# Patient Record
Sex: Male | Born: 1955 | Race: Asian | Hispanic: No | Marital: Married | Smoking: Former smoker
Health system: Southern US, Community
[De-identification: ages and names within clinical notes are randomized; demographics above are authoritative.]

## PROBLEM LIST (undated history)

## (undated) DIAGNOSIS — A159 Respiratory tuberculosis unspecified: Secondary | ICD-10-CM

## (undated) DIAGNOSIS — E119 Type 2 diabetes mellitus without complications: Secondary | ICD-10-CM

## (undated) DIAGNOSIS — G459 Transient cerebral ischemic attack, unspecified: Secondary | ICD-10-CM

## (undated) DIAGNOSIS — E785 Hyperlipidemia, unspecified: Secondary | ICD-10-CM

## (undated) DIAGNOSIS — H269 Unspecified cataract: Secondary | ICD-10-CM

## (undated) DIAGNOSIS — I10 Essential (primary) hypertension: Secondary | ICD-10-CM

## (undated) HISTORY — DX: Hyperlipidemia, unspecified: E78.5

## (undated) HISTORY — DX: Transient cerebral ischemic attack, unspecified: G45.9

## (undated) HISTORY — PX: NO PAST SURGERIES: SHX2092

## (undated) HISTORY — DX: Essential (primary) hypertension: I10

## (undated) HISTORY — DX: Unspecified cataract: H26.9

## (undated) HISTORY — PX: POLYPECTOMY: SHX149

## (undated) HISTORY — DX: Respiratory tuberculosis unspecified: A15.9

## (undated) HISTORY — DX: Type 2 diabetes mellitus without complications: E11.9

---

## 2000-03-31 DIAGNOSIS — G459 Transient cerebral ischemic attack, unspecified: Secondary | ICD-10-CM

## 2000-03-31 HISTORY — DX: Transient cerebral ischemic attack, unspecified: G45.9

## 2003-02-28 ENCOUNTER — Encounter: Payer: Self-pay | Admitting: Gastroenterology

## 2006-05-05 ENCOUNTER — Ambulatory Visit: Payer: Self-pay | Admitting: Gastroenterology

## 2006-05-06 ENCOUNTER — Ambulatory Visit: Payer: Self-pay | Admitting: Gastroenterology

## 2006-05-06 ENCOUNTER — Encounter (INDEPENDENT_AMBULATORY_CARE_PROVIDER_SITE_OTHER): Payer: Self-pay | Admitting: Specialist

## 2009-01-15 ENCOUNTER — Encounter: Admission: RE | Admit: 2009-01-15 | Discharge: 2009-01-15 | Payer: Self-pay | Admitting: Emergency Medicine

## 2009-06-14 ENCOUNTER — Encounter: Admission: RE | Admit: 2009-06-14 | Discharge: 2009-06-14 | Payer: Self-pay | Admitting: Emergency Medicine

## 2009-12-04 ENCOUNTER — Encounter: Admission: RE | Admit: 2009-12-04 | Discharge: 2009-12-04 | Payer: Self-pay | Admitting: Unknown Physician Specialty

## 2010-04-18 ENCOUNTER — Other Ambulatory Visit: Payer: Self-pay | Admitting: Gastroenterology

## 2010-04-18 ENCOUNTER — Ambulatory Visit
Admission: RE | Admit: 2010-04-18 | Discharge: 2010-04-18 | Payer: Self-pay | Source: Home / Self Care | Attending: Gastroenterology | Admitting: Gastroenterology

## 2010-04-18 DIAGNOSIS — R109 Unspecified abdominal pain: Secondary | ICD-10-CM | POA: Insufficient documentation

## 2010-04-18 LAB — BASIC METABOLIC PANEL
BUN: 19 mg/dL (ref 6–23)
CO2: 27 mEq/L (ref 19–32)
Calcium: 9.9 mg/dL (ref 8.4–10.5)
Chloride: 94 mEq/L — ABNORMAL LOW (ref 96–112)
Creatinine, Ser: 1.2 mg/dL (ref 0.4–1.5)
GFR: 66.08 mL/min (ref >60.00)
Glucose, Bld: 185 mg/dL — ABNORMAL HIGH (ref 70–99)
Potassium: 3.7 mEq/L (ref 3.5–5.1)
Sodium: 130 mEq/L — ABNORMAL LOW (ref 135–145)

## 2010-04-18 LAB — CBC WITH DIFFERENTIAL/PLATELET
Basophils Absolute: 0 10*3/uL (ref 0.0–0.1)
Basophils Relative: 0.5 % (ref 0.0–3.0)
Eosinophils Absolute: 0.2 10*3/uL (ref 0.0–0.7)
Eosinophils Relative: 1.6 % (ref 0.0–5.0)
HCT: 43.9 % (ref 39.0–52.0)
Hemoglobin: 15.2 g/dL (ref 13.0–17.0)
Lymphocytes Relative: 21.3 % (ref 12.0–46.0)
Lymphs Abs: 2.1 10*3/uL (ref 0.7–4.0)
MCHC: 34.6 g/dL (ref 30.0–36.0)
MCV: 90.9 fl (ref 78.0–100.0)
Monocytes Absolute: 1.2 10*3/uL — ABNORMAL HIGH (ref 0.1–1.0)
Monocytes Relative: 12.2 % — ABNORMAL HIGH (ref 3.0–12.0)
Neutro Abs: 6.4 10*3/uL (ref 1.4–7.7)
Neutrophils Relative %: 64.4 % (ref 43.0–77.0)
Platelets: 374 10*3/uL (ref 150.0–400.0)
RBC: 4.83 Mil/uL (ref 4.22–5.81)
RDW: 12.4 % (ref 11.5–14.6)
WBC: 10 10*3/uL (ref 4.5–10.5)

## 2010-04-18 LAB — HEPATIC FUNCTION PANEL
ALT: 16 U/L (ref 0–53)
AST: 12 U/L (ref 0–37)
Albumin: 4 g/dL (ref 3.5–5.2)
Alkaline Phosphatase: 19 U/L — ABNORMAL LOW (ref 39–117)
Bilirubin, Direct: 0.1 mg/dL (ref 0.0–0.3)
Total Bilirubin: 0.5 mg/dL (ref 0.3–1.2)
Total Protein: 7.4 g/dL (ref 6.0–8.3)

## 2010-04-18 LAB — TSH: TSH: 2.15 u[IU]/mL (ref 0.35–5.50)

## 2010-04-22 ENCOUNTER — Other Ambulatory Visit: Payer: Self-pay | Admitting: Gastroenterology

## 2010-04-22 ENCOUNTER — Ambulatory Visit
Admission: RE | Admit: 2010-04-22 | Discharge: 2010-04-22 | Payer: Self-pay | Source: Home / Self Care | Attending: Gastroenterology | Admitting: Gastroenterology

## 2010-04-22 LAB — BASIC METABOLIC PANEL
BUN: 14 mg/dL (ref 6–23)
CO2: 27 mEq/L (ref 19–32)
Calcium: 9.7 mg/dL (ref 8.4–10.5)
Chloride: 97 mEq/L (ref 96–112)
Creatinine, Ser: 1.2 mg/dL (ref 0.4–1.5)
GFR: 70.07 mL/min (ref >60.00)
Glucose, Bld: 346 mg/dL — ABNORMAL HIGH (ref 70–99)
Potassium: 3.9 mEq/L (ref 3.5–5.1)
Sodium: 132 mEq/L — ABNORMAL LOW (ref 135–145)

## 2010-05-02 ENCOUNTER — Other Ambulatory Visit: Payer: Self-pay | Admitting: Gastroenterology

## 2010-05-02 ENCOUNTER — Encounter: Payer: Self-pay | Admitting: Gastroenterology

## 2010-05-02 ENCOUNTER — Encounter (INDEPENDENT_AMBULATORY_CARE_PROVIDER_SITE_OTHER): Payer: Self-pay | Admitting: *Deleted

## 2010-05-02 ENCOUNTER — Other Ambulatory Visit: Payer: BC Managed Care – PPO

## 2010-05-02 ENCOUNTER — Ambulatory Visit (INDEPENDENT_AMBULATORY_CARE_PROVIDER_SITE_OTHER): Payer: BC Managed Care – PPO | Admitting: Gastroenterology

## 2010-05-02 ENCOUNTER — Ambulatory Visit: Admit: 2010-05-02 | Payer: Self-pay | Admitting: Gastroenterology

## 2010-05-02 DIAGNOSIS — E119 Type 2 diabetes mellitus without complications: Secondary | ICD-10-CM

## 2010-05-02 DIAGNOSIS — E871 Hypo-osmolality and hyponatremia: Secondary | ICD-10-CM

## 2010-05-02 DIAGNOSIS — R109 Unspecified abdominal pain: Secondary | ICD-10-CM

## 2010-05-02 LAB — BASIC METABOLIC PANEL
CO2: 25 mEq/L (ref 19–32)
Glucose, Bld: 345 mg/dL — ABNORMAL HIGH (ref 70–99)
Potassium: 3.8 mEq/L (ref 3.5–5.1)
Sodium: 132 mEq/L — ABNORMAL LOW (ref 135–145)

## 2010-05-02 NOTE — Assessment & Plan Note (Signed)
Summary: Abdominal pain, N/V   History of Present Illness Visit Type: Follow-up Visit Primary GI MD: Elie Goody MD [facility] Primary Provider: Theadora Rama, MD Requesting Provider: Theadora Rama, MD Chief Complaint: Patient c/o 1 week epigastric abdominal pain which is dull in nature. It has since moved to the periumbilical area. Patient c/o 2 episodes vomiting. History of Present Illness:   This is a 55 year old Asian male who relates the acute onset of epigastric pain, nausea, vomiting, fevers, and malaise, beginning one week ago. Temperature peaked at 101 and for the past 2 days he has not noted fever or vomiting. His epigastric pain has now improved and now has become a periumbilical abdominal discomfort. Nausea persists. No diarrhea or change in bowel habit noted.   GI Review of Systems    Reports abdominal pain, belching, loss of appetite, and  vomiting.     Location of  Abdominal pain: upper abdomen/periumbilical.    Denies acid reflux, bloating, chest pain, dysphagia with liquids, dysphagia with solids, heartburn, nausea, vomiting blood, weight loss, and  weight gain.        Denies anal fissure, black tarry stools, change in bowel habit, constipation, diarrhea, diverticulosis, fecal incontinence, heme positive stool, hemorrhoids, irritable bowel syndrome, jaundice, light color stool, liver problems, rectal bleeding, and  rectal pain. Preventive Screening-Counseling & Management  Alcohol-Tobacco     Smoking Status: quit  Caffeine-Diet-Exercise     Does Patient Exercise: no      Drug Use:  no.     Current Medications (verified): 1)  Aspirin 81 Mg Tbec (Aspirin) .... Take 1 Tablet By Mouth Once A Day 2)  Lisinopril 20 Mg Tabs (Lisinopril) .... Take 1 Tablet By Mouth Once Daily 3)  Zofran 4 Mg Tabs (Ondansetron Hcl) .... Take 1 Tablet By Mouth Four Times Daily As Needed For Nausea 4)  Hyoscyamine Sulfate 0.125 Mg Tabs (Hyoscyamine Sulfate) .... Take 1 Tablet By Mouth Two  Times A Day As Needed 5)  Fenofibrate 160 Mg Tabs (Fenofibrate) .... One Tablet By Mouth Once Daily 6)  Metformin Hcl 500 Mg Tabs (Metformin Hcl) .... 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 -stopped 8 years ago Illicit Drug Use - no Patient does not get regular exercise.  Smoking Status:  quit Drug Use:  no Does Patient Exercise:  no  Review of Systems       The pertinent positives and negatives are noted as above and in the HPI. All other ROS were reviewed and were negative.   Vital Signs:  Patient profile:   55 year old male Height:      70 inches Weight:      173.50 pounds BMI:     24.98 BSA:     1.97 Pulse rate:   84 / minute Pulse rhythm:   regular BP sitting:   104 / 70  (right arm)  Vitals Entered By: Lamona Curl CMA Duncan Dull) (April 18, 2010 10:35 AM)  Physical Exam  General:  Well developed, well nourished, no acute distress. Fatigued-appearing. Head:  Normocephalic and atraumatic. Eyes:  PERRLA, no icterus. Ears:  Normal auditory acuity. Mouth:  No deformity or lesions, dentition normal. Neck:  Supple; no masses or thyromegaly. Lungs:  Clear throughout to auscultation. Heart:  Regular rate and rhythm; no murmurs, rubs,  or bruits. Abdomen:  Soft, nontender and nondistended. No masses, hepatosplenomegaly or hernias noted. Normal bowel sounds. Msk:  Symmetrical with no gross deformities. Normal posture. Pulses:  Normal pulses noted. Extremities:  No clubbing, cyanosis, edema  or deformities noted. Neurologic:  Alert and  oriented x4;  grossly normal neurologically. Cervical Nodes:  No significant cervical adenopathy. Inguinal Nodes:  No significant inguinal adenopathy. Psych:  Alert and cooperative. Normal mood and affect.  Impression & Recommendations:  Problem # 1:  ABDOMINAL PAIN OTHER SPECIFIED SITE (ICD-789.09) Presumed gastroenteritis, likely viral. Obtain blood work. Push oral fluids and begin a bland diet with p.r.n. Zofran and Levsin. Return office visit in one week if the symptoms do not rapidly resolve we will proceed with further evaluation with abdominal ultrasound. Orders: TLB-CBC Platelet - w/Differential (85025-CBCD) TLB-BMP (Basic Metabolic Panel-BMET) (80048-METABOL) TLB-TSH (Thyroid Stimulating Hormone) (84443-TSH) TLB-Hepatic/Liver Function Pnl (80076-HEPATIC)  Problem # 2:  SCREENING COLORECTAL-CANCER (ICD-V76.51) Screening colonoscopy due to February 2018.  Patient Instructions: 1)  We have sent Hyoscyamine 0.125 mg tablets to your pharmacy for you to take 1 tablet by mouth two times a day as needed. 2)  We have also sent Zofran 4 mg tablets to your pharmacy for you to take up to four times daily as needed for nausea. 3)  Your physician has recommended that you have a [facility] Panel drawn on the basement floor before leaving the office today. 4)  Your follow-up visit with Dr. Russella Dar has been scheduled for 05/02/10 at 9:30am. 5)  Copy sent to : Theadora Rama, MD 6)  The medication list was reviewed and reconciled.  All changed / newly prescribed medications were explained.  A complete medication list was provided to the patient / caregiver.  Prescriptions: HYOSCYAMINE SULFATE 0.125 MG TABS (HYOSCYAMINE SULFATE) Take 1 tablet by mouth two times a day as needed  #60 x 0   Entered by:   Christie Nottingham CMA (AAMA)   Authorized by:   Meryl Dare MD [facility]   Signed by:   Christie Nottingham CMA (AAMA) on 04/18/2010   Method used:    [facility]        [facility]  [place] [address]       [address]       [place], [place]  [address]       Ph: [phone] or [phone]       Fax: [phone]   RxID:   [id number] ZOFRAN 4 MG TABS (ONDANSETRON HCL) Take 1 tablet by mouth four times daily as needed for nausea  #60 x 1   Entered by:   Christie Nottingham CMA (AAMA)   Authorized by:   Meryl Dare MD [facility]   Signed by:   Christie Nottingham CMA (AAMA) on 04/18/2010   Method used:   [facility]        [facility]  [place] [address]retail[facility]       [address]       [place], [place]  [address]       Ph: [phone] or [phone]       Fax: [phone]   RxID:   [id number]

## 2010-05-02 NOTE — Procedures (Signed)
Summary: Colonoscopy   Colonoscopy  Procedure date:  02/28/2003  Findings:      Results: Hemorrhoids.     Location:  [facility].    Procedures Next Due Date:    Colonoscopy: 02/2010 Patient Name: Malik Holland, Malik Holland MRN:  Procedure Procedures: Colonoscopy CPT: [address].  Personnel: Endoscopist: Venita Lick. Russella Dar, MD, Clementeen Graham.  Referred By: Rogene Houston, MD.  Exam Location: Exam performed in Outpatient Clinic. Outpatient  Patient Consent: Procedure, Alternatives, Risks and Benefits discussed, consent obtained, from patient. Consent was obtained by the RN.  Indications Symptoms: Hematochezia.  History  Current Medications: Patient is not currently taking Coumadin.  Pre-Exam Physical: Performed Feb 28, 2003. Entire physical exam was normal.  Exam Exam: Extent of exam reached: Cecum, extent intended: Cecum.  The cecum was identified by appendiceal orifice and IC valve. Colon retroflexion performed. ASA Classification: II. Tolerance: good.  Monitoring: Pulse and BP monitoring, Oximetry used. Supplemental O2 given.  Colon Prep Used Golytely for colon prep. Prep results: good.  Sedation Meds: Patient assessed and found to be appropriate for moderate (conscious) sedation. Fentanyl 75 mcg. given IV. Versed 6 mg. given IV.  Findings NORMAL EXAM: Cecum to Sigmoid Colon.  HEMORRHOIDS: Internal. Size: Small. Not bleeding. Not thrombosed. ICD9: Hemorrhoids, Internal: 455.0.   Assessment  Diagnoses: 455.0: Hemorrhoids, Internal.   Events  Unplanned Interventions: No intervention was required.  Unplanned Events: There were no complications. Plans Medication Plan: Continue current medications.  Patient Education: Patient given standard instructions for: Hemorrhoids.  Disposition: After procedure patient sent to recovery. After recovery patient sent home.  Scheduling/Referral: Colonoscopy, to [facility] T. Russella Dar, MD, [facility], around Feb 27, 2010.    This report  was created from the original endoscopy report, which was reviewed and signed by the above listed endoscopist.     cc: Dianna Limbo, MD

## 2010-05-02 NOTE — Procedures (Signed)
Summary: Colonoscopy   Colonoscopy  Procedure date:  05/06/2006  Findings:      Results: Hemorrhoids.     Location:  [facility].    Procedures Next Due Date:    Colonoscopy: 05/2016 Patient Name: Malik Holland, Malik Holland MRN:  Procedure Procedures: Colonoscopy CPT: [address].    with biopsy. CPT: Q5068410.  Personnel: Endoscopist: Venita Lick. Russella Dar, MD, Clementeen Graham.  Exam Location: Exam performed in Outpatient Clinic. Outpatient  Patient Consent: Procedure, Alternatives, Risks and Benefits discussed, consent obtained, from patient. Consent was obtained by the RN.  Indications Symptoms: Diarrhea Weight Loss. Change in bowel habits.  History  Current Medications: Patient is not currently taking Coumadin.  Pre-Exam Physical: Performed May 06, 2006. Cardio-pulmonary exam, Rectal exam, HEENT exam , Abdominal exam, Mental status exam WNL.  Comments: Pt. history reviewed/updated, physical exam performed prior to initiation of sedation?Yes Exam Exam: Extent of exam reached: Cecum, extent intended: Cecum.  The cecum was identified by appendiceal orifice and IC valve. Time to Cecum: 00:03: 07. Time for Withdrawl: 00:09:12. Colon retroflexion performed. ASA Classification: II. Tolerance: excellent.  Monitoring: Pulse and BP monitoring, Oximetry used. Supplemental O2 given.  Colon Prep Used MoviPrep for colon prep. Prep results: excellent.  Sedation Meds: Patient assessed and found to be appropriate for moderate (conscious) sedation. Fentanyl 75 mcg. given IV. Versed 8 mg. given IV.  Findings NORMAL EXAM: Terminal Ileum to Sigmoid Colon. Biopsy/Normal Exam taken. Comments: TI mucosa appears atrophic-random biopsies taken in the TI and colon.  HEMORRHOIDS: Internal. Size: Small. Not bleeding. Not thrombosed. ICD9: Hemorrhoids, Internal: 455.0.   Assessment  Diagnoses: 455.0: Hemorrhoids, Internal.   Events  Unplanned Interventions: No intervention was required.  Unplanned  Events: There were no complications. Plans  Post Exam Instructions: Post sedation instructions given.  Medication Plan: Await pathology. Antibiotics: Flagyl/Metronidazole 500mg  BID, for 7 days.  Antispasmodics: Hyoscyamine SL prn,   Patient Education: Patient given standard instructions for: Hemorrhoids.  Disposition: After procedure patient sent to recovery. After recovery patient sent home.  Scheduling/Referral: Colonoscopy, to [facility] T. Russella Dar, MD, [facility], around May 06, 2016.  Office Visit, to [employer]. Russella Dar, MD, [facility], around May 27, 2006.    This report was created from the original endoscopy report, which was reviewed and signed by the above listed endoscopist.    cc: Theadora Rama, MD

## 2010-05-08 NOTE — Assessment & Plan Note (Signed)
Summary: F/U ABD PAIN//ALL   Vital Signs:  Patient profile:   55 year old male Height:      70 inches Weight:      173 pounds BMI:     24.91 Pulse rate:   80 / minute Pulse rhythm:   regular BP sitting:   140 / 90  (left arm)  Vitals Entered By: Milford Cage NCMA (May 02, 2010 10:04 AM)  History of Present Illness Visit Type: Follow-up Visit Primary GI MD: Elie Goody MD [facility] Primary Provider: Theadora Rama, MD Requesting Provider: Theadora Rama, MD Chief Complaint: follow-up abdominal pain pt. states he is doing better. History of Present Illness:   This is a return office visit for abdominal pain, associated with nausea and vomiting. His symptoms abated after just 2 or 3 days, however, he still notes mild symptoms of early satiety. He did have a low sodium at 130 which improved to 132. His blood sugars have been running a little higher than usual.   GI Review of Systems      Denies abdominal pain, acid reflux, belching, bloating, chest pain, dysphagia with liquids, dysphagia with solids, heartburn, loss of appetite, nausea, vomiting, vomiting blood, weight loss, and  weight gain.        Denies anal fissure, black tarry stools, change in bowel habit, constipation, diarrhea, diverticulosis, fecal incontinence, heme positive stool, hemorrhoids, irritable bowel syndrome, jaundice, light color stool, liver problems, rectal bleeding, and  rectal pain.  Current Medications (verified): 1)  Aspirin 81 Mg Tbec (Aspirin) .... Take 1 Tablet By Mouth Once A Day 2)  Lisinopril 20 Mg Tabs (Lisinopril) .... Take 1 Tablet By Mouth Once Daily 3)  Zofran 4 Mg Tabs (Ondansetron Hcl) .... Take 1 Tablet By Mouth Four Times Daily As Needed For Nausea 4)  Hyoscyamine Sulfate 0.125 Mg Tabs (Hyoscyamine Sulfate) .... Take 1 Tablet By Mouth Two Times A Day As Needed 5)  Fenofibrate 160 Mg Tabs (Fenofibrate) .... One Tablet By Mouth Once Daily 6)  Metformin Hcl 500 Mg Tabs (Metformin Hcl)  .... One Tablet By Mouth Two Times A Day  Allergies (verified): No Known Drug Allergies  Past History:  Past Medical History: Reviewed history from 04/18/2010 and no changes required. Internal hemorrhoids Psoriasis Hyperlipidemia Hypertension Diabetes Type II Hx of TIA  Past Surgical History: Reviewed history from 04/18/2010 and no changes required. unremarkable  Family History: Reviewed history from 04/18/2010 and no changes required. Bile Duct cancer: Mother No FH of Colon Cancer: Family History of Prostate Cancer: Father Family History of Diabetes: Sister, Father  Social History: Reviewed history from 04/18/2010 and no changes required. 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 -stopped 8 years ago Illicit Drug Use - no Patient does not get regular exercise.   Physical Exam  General:  Well developed, well nourished, no acute distress. Head:  Normocephalic and atraumatic. Eyes:  PERRLA, no icterus. Ears:  Normal auditory acuity. Mouth:  No deformity or lesions, dentition normal. Lungs:  Clear throughout to auscultation. Heart:  Regular rate and rhythm; no murmurs, rubs,  or bruits. Abdomen:  Soft, nontender and nondistended. No masses, hepatosplenomegaly or hernias noted. Normal bowel sounds. Psych:  Alert and cooperative. Normal mood and affect.  Impression & Recommendations:  Problem # 1:  ABDOMINAL PAIN OTHER SPECIFIED SITE (ICD-789.09) Nausea vomiting, and abdominal pain all resolved rapidly. I suspect he had a self-limited gastroenteritis.  Problem # 2:  HYPONATREMIA (  ICD-276.1) Persistent hypernatremia. The second sodium of 132, was associated with a blood sugar of 346. Repeat BMET today and further followup with his primary physician and endocrinologist. Orders: TLB-BMP (Basic Metabolic Panel-BMET) (80048-METABOL)  Problem # 3:  DIABETES MELLITUS-TYPE II (ICD-250.00) He has an appointment with an  endocrinologist for further management of his diabetes mellitus.  Patient Instructions: 1)  Please go directly to the basement to have your labs drawn.  2)  Please continue current medications.  3)  Copy sent to : Theadora Rama, MD 4)  The medication list was reviewed and reconciled.  All changed / newly prescribed medications were explained.  A complete medication list was provided to the patient / caregiver.  Orders Added: 1)  TLB-BMP (Basic Metabolic Panel-BMET) [80048-METABOL]

## 2010-06-26 ENCOUNTER — Encounter: Payer: BC Managed Care – PPO | Attending: Endocrinology | Admitting: Dietician

## 2010-06-26 DIAGNOSIS — E119 Type 2 diabetes mellitus without complications: Secondary | ICD-10-CM | POA: Insufficient documentation

## 2010-06-26 DIAGNOSIS — Z713 Dietary counseling and surveillance: Secondary | ICD-10-CM | POA: Insufficient documentation

## 2010-08-16 NOTE — Assessment & Plan Note (Signed)
[facility]                         GASTROENTEROLOGY OFFICE NOTE   NAME:Malik Holland, Malik Holland                           MRN:          [id number]  DATE:05/05/2006                            DOB:          [birth year]    REFERRING PHYSICIAN:  Dr. Cleda Clarks   REASON FOR REFERRAL:  Change in bowel habits, diarrhea, and weight loss.   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  He relates a change in bowel habits that  began just before Christmas of 2007 with persistent symptoms until now.  He generally had about two formed bowel movements every 3 days as a  routine bowel pattern for many years and in mid December his bowel  pattern changed to three loose bowel movements over 2 days.  They were  generally brought on by meals and there was associated lower abdominal  discomfort around the time of bowel movement.  He has had about a 10  pound weight loss over the past 2 months as well.  He notes no melena,  hematochezia, change in stool caliber, nausea, vomiting, fevers, chills,  or loss of appetite.  His mother passed away on 04/03/06, with  bile duct cancer and his father moved in to live with him after this.  He states his diet has changed slightly since living with his father and  he now routinely eats breakfast which is new for him.  Other eating  habits have not changed.  He has no new medication usage and denies any  recent antibiotic usage.  He has been lactose intolerant for many years  and denies any lactose intake.  Blood work from April 27, 2006, shows  a normal CMET, lipid panel, thyroid panel, CBC, and urinalysis.  He  underwent a colonoscopy for hematochezia in November of 2004 which  showed only internal hemorrhoids.   FAMILY HISTORY:  Negative for colon cancer, colon polyps, and  inflammatory bowel disease.   CURRENT MEDICATIONS:  1. TriCor daily.  2. Aspirin 81 mg daily.   ALLERGIES:  No known drug allergies.   PAST MEDICAL HISTORY:  Psoriasis, internal hemorrhoids, hypertension,  hyperlipidemia.   SOCIAL HISTORY:   REVIEW OF SYSTEMS:  Per the handwritten form.   PHYSICAL EXAMINATION:  GENERAL:  No acute distress.  VITAL SIGNS:  Height 5 feet 10 inches, weight 182 pounds, blood pressure  128/84, pulse 72 and regular.  HEENT:  Anicteric sclerae, oropharynx clear.  CHEST:  Clear to auscultation bilaterally.  HEART:  Regular rate and rhythm without murmurs appreciated.  ABDOMEN:  Soft, nontender, and nondistended.  Normal active bowel  sounds.  No palpable organomegaly, masses, or hernias.  RECTAL:  Deferred at the time of colonoscopy.  NEUROLOGY:  Alert and oriented x3.  Grossly nonfocal.   ASSESSMENT:  Change in bowel habits with looser stools, intermittent  left lower quadrant pain, and an unexplained 10 pound weight loss.  Rule  out colorectal neoplasms, inflammatory bowel disease, and other  gastrointestinal disorders.  Consider also functional symptoms related  to  his family situation.  Risks, benefits, and alternatives to  colonoscopy with possible biopsy and possible polypectomy discussed with  the patient and he consents to proceed.  This will be scheduled  electively.     Venita Lick. Russella Dar, MD, [facility]  Electronically Signed    MTS/MedQ  DD: 05/05/2006  DT: 05/05/2006  Job #: [id number]   cc:   Theadora Rama, M.D.

## 2011-03-18 IMAGING — CR DG CHEST 2V
2 series · 2 of 2 positions shown · non-contrast
Comparison: 06/14/2009

CLINICAL DATA: Follow up pleural density

CHEST - 2 VIEW

[view not recorded (1 of 2)]
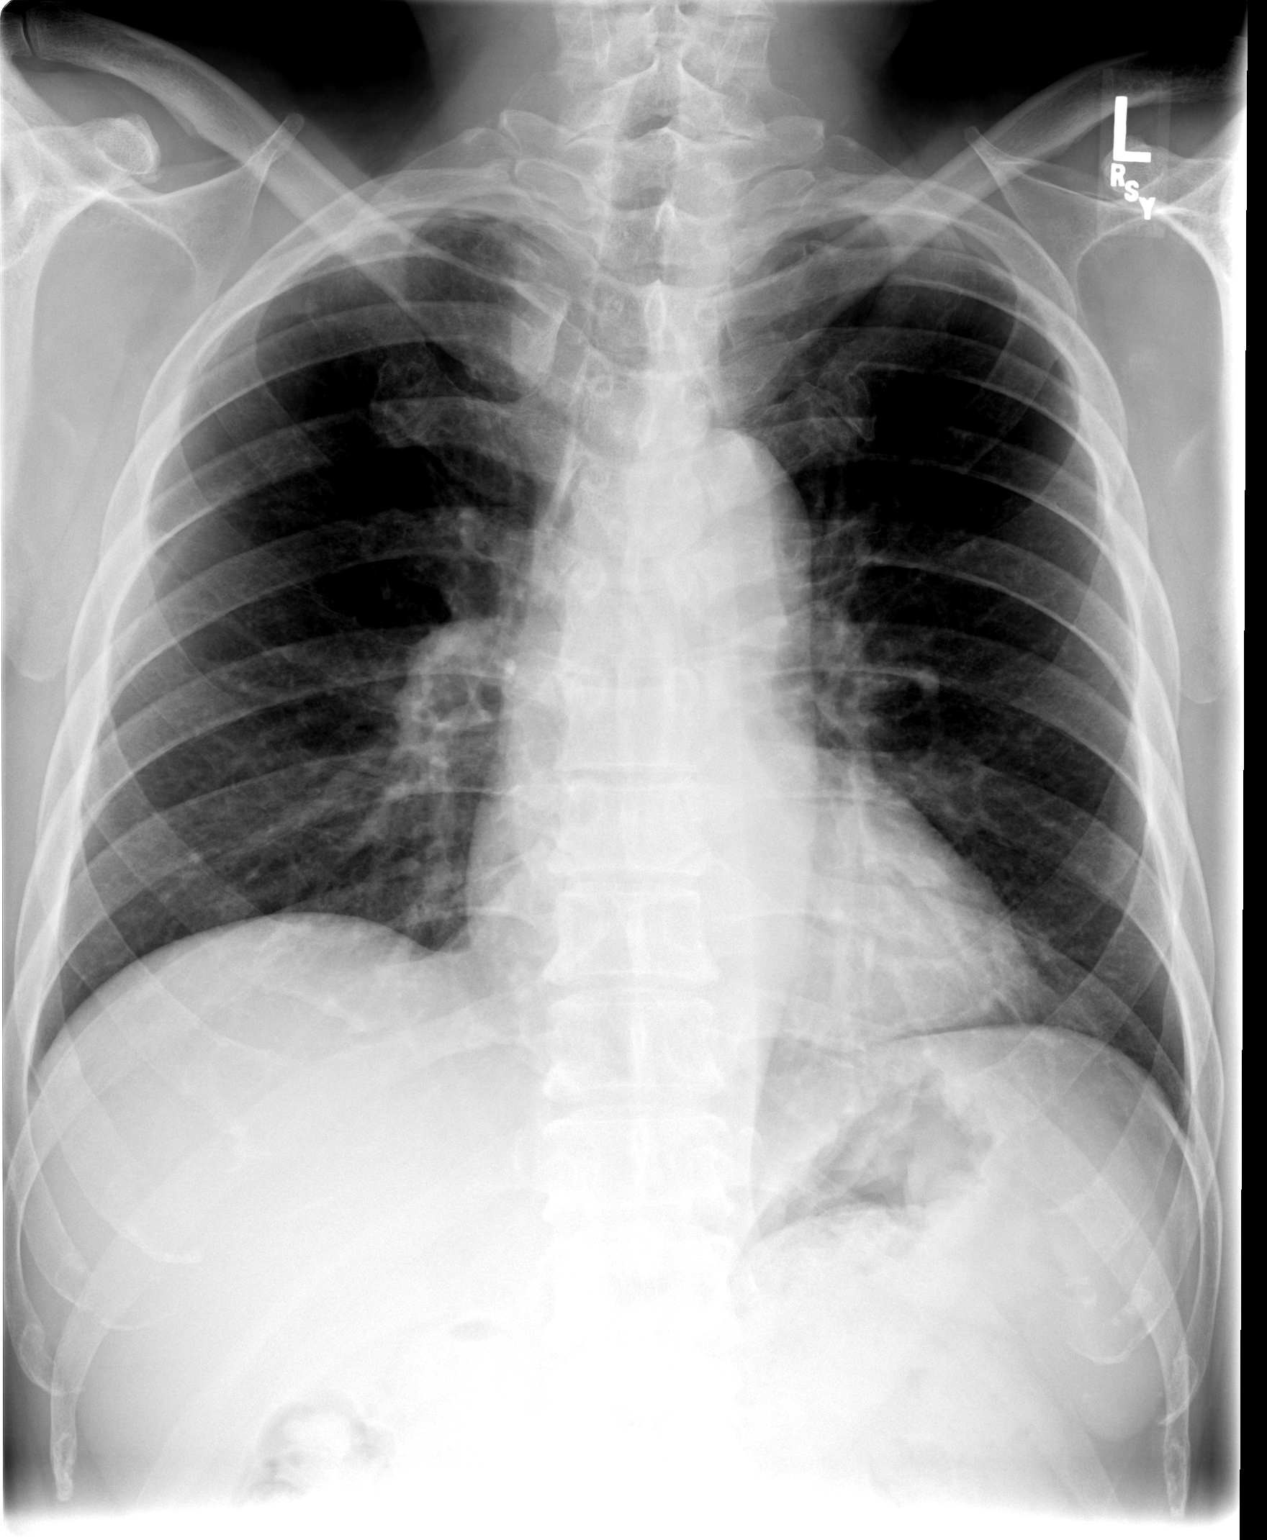

[view not recorded (2 of 2)]
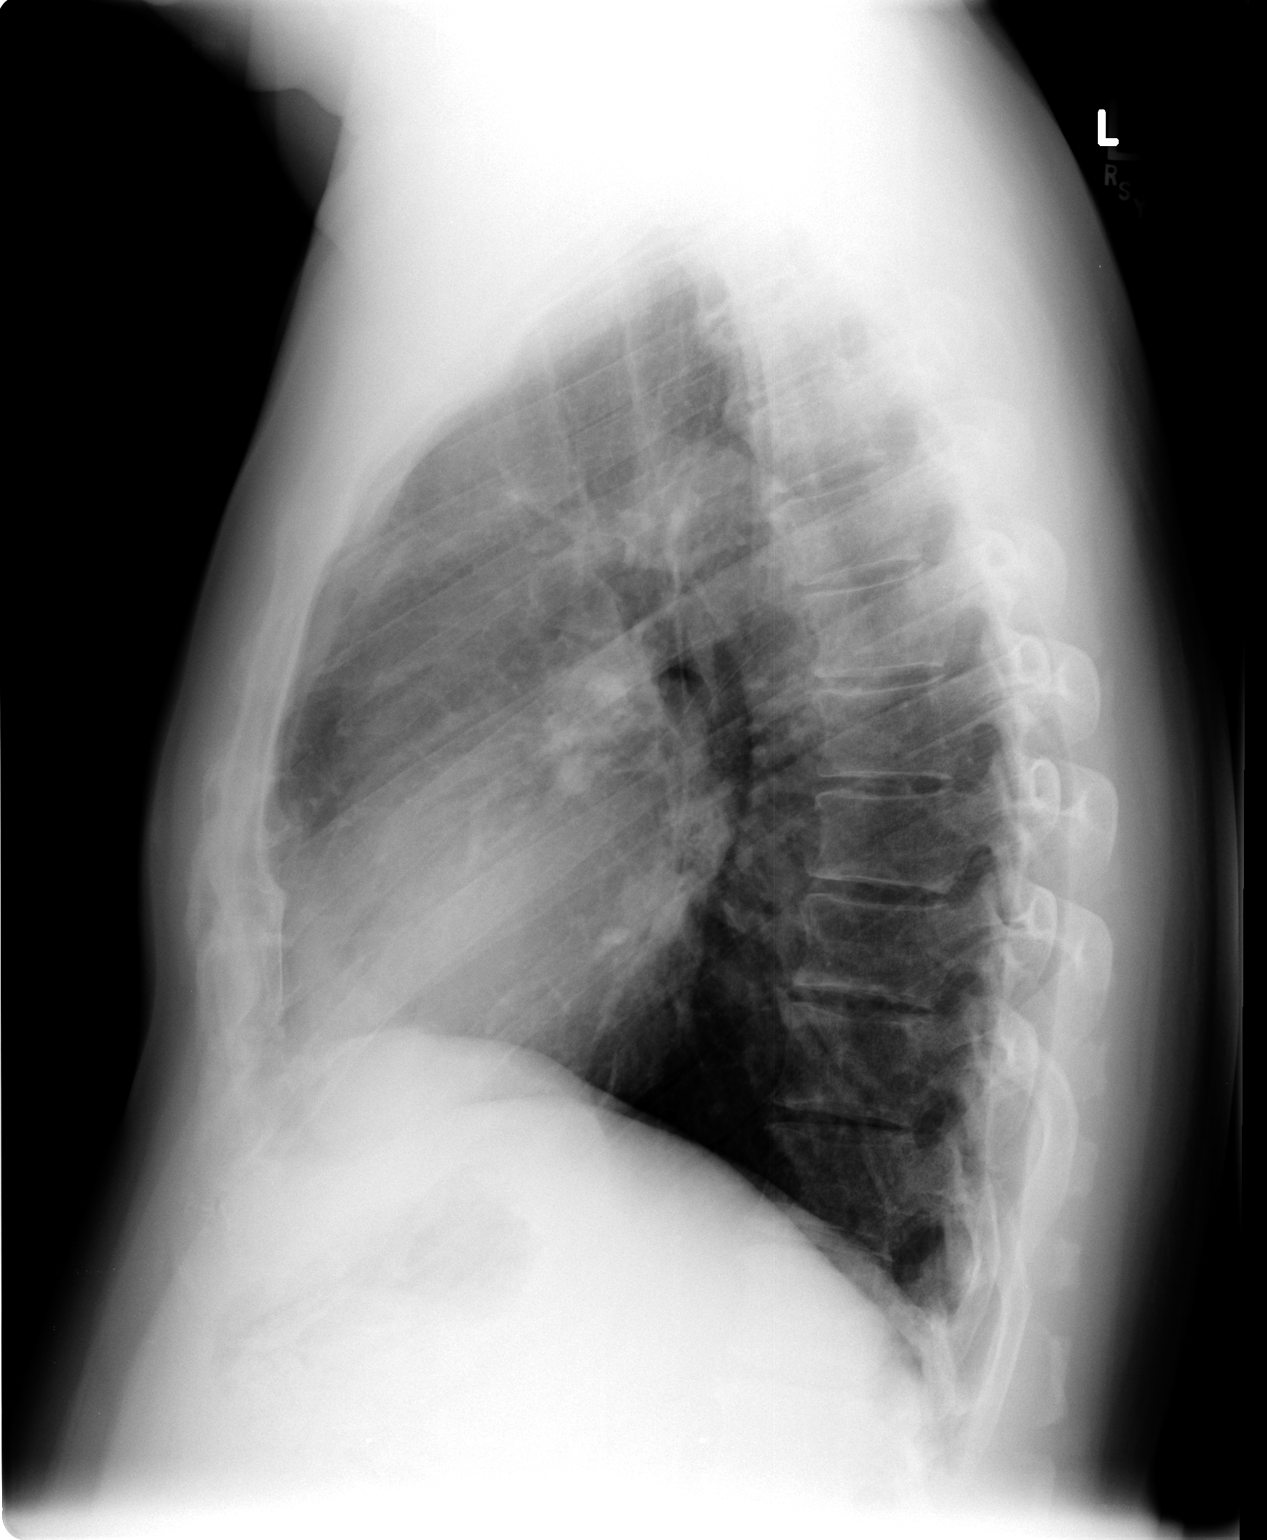

[2 of 2 positions shown; findings below may reference images not displayed]

FINDINGS: The pleural based density along the right hemithorax
laterally at the level of the minor fissure is much less
conspicuous compared to prior studies.  No new pulmonary nodule.
Heart is normal in size.  No pneumothorax or pleural effusion.
IMPRESSION: Pleural based density questioned in December 2008 is less prominent
today supporting benign etiology.  No active cardiopulmonary
disease.

## 2015-04-01 DIAGNOSIS — H269 Unspecified cataract: Secondary | ICD-10-CM

## 2015-04-01 HISTORY — DX: Unspecified cataract: H26.9

## 2015-08-29 DIAGNOSIS — E118 Type 2 diabetes mellitus with unspecified complications: Secondary | ICD-10-CM | POA: Diagnosis not present

## 2015-08-29 DIAGNOSIS — E789 Disorder of lipoprotein metabolism, unspecified: Secondary | ICD-10-CM | POA: Diagnosis not present

## 2015-10-16 DIAGNOSIS — Z205 Contact with and (suspected) exposure to viral hepatitis: Secondary | ICD-10-CM | POA: Diagnosis not present

## 2015-10-18 DIAGNOSIS — E118 Type 2 diabetes mellitus with unspecified complications: Secondary | ICD-10-CM | POA: Diagnosis not present

## 2016-01-24 DIAGNOSIS — E119 Type 2 diabetes mellitus without complications: Secondary | ICD-10-CM | POA: Diagnosis not present

## 2016-01-24 DIAGNOSIS — H2513 Age-related nuclear cataract, bilateral: Secondary | ICD-10-CM | POA: Diagnosis not present

## 2016-02-13 DIAGNOSIS — E789 Disorder of lipoprotein metabolism, unspecified: Secondary | ICD-10-CM | POA: Diagnosis not present

## 2016-02-13 DIAGNOSIS — E118 Type 2 diabetes mellitus with unspecified complications: Secondary | ICD-10-CM | POA: Diagnosis not present

## 2016-02-13 DIAGNOSIS — I10 Essential (primary) hypertension: Secondary | ICD-10-CM | POA: Diagnosis not present

## 2016-04-10 ENCOUNTER — Encounter: Payer: Self-pay | Admitting: Gastroenterology

## 2016-06-19 DIAGNOSIS — E789 Disorder of lipoprotein metabolism, unspecified: Secondary | ICD-10-CM | POA: Diagnosis not present

## 2016-06-19 DIAGNOSIS — I10 Essential (primary) hypertension: Secondary | ICD-10-CM | POA: Diagnosis not present

## 2016-06-19 DIAGNOSIS — E118 Type 2 diabetes mellitus with unspecified complications: Secondary | ICD-10-CM | POA: Diagnosis not present

## 2016-08-06 ENCOUNTER — Encounter: Payer: Self-pay | Admitting: Gastroenterology

## 2016-09-04 DIAGNOSIS — R079 Chest pain, unspecified: Secondary | ICD-10-CM | POA: Diagnosis not present

## 2016-09-04 DIAGNOSIS — E118 Type 2 diabetes mellitus with unspecified complications: Secondary | ICD-10-CM | POA: Diagnosis not present

## 2016-09-04 DIAGNOSIS — K219 Gastro-esophageal reflux disease without esophagitis: Secondary | ICD-10-CM | POA: Diagnosis not present

## 2016-09-04 DIAGNOSIS — M549 Dorsalgia, unspecified: Secondary | ICD-10-CM | POA: Diagnosis not present

## 2016-09-23 ENCOUNTER — Ambulatory Visit (AMBULATORY_SURGERY_CENTER): Payer: Self-pay | Admitting: *Deleted

## 2016-09-23 VITALS — Ht 70.0 in | Wt 165.8 lb

## 2016-09-23 DIAGNOSIS — Z1211 Encounter for screening for malignant neoplasm of colon: Secondary | ICD-10-CM

## 2016-09-23 MED ORDER — NA SULFATE-K SULFATE-MG SULF 17.5-3.13-1.6 GM/177ML PO SOLN: ORAL | 0 refills | Status: DC

## 2016-09-23 NOTE — Progress Notes (Signed)
No allergies to eggs or soy. No prior anesthesia.  Pt given Emmi instructions for colonoscopy  No oxygen use  No diet drug use  

## 2016-09-24 ENCOUNTER — Encounter: Payer: Self-pay | Admitting: Gastroenterology

## 2016-09-28 HISTORY — PX: COLONOSCOPY: SHX174

## 2016-09-30 DIAGNOSIS — E118 Type 2 diabetes mellitus with unspecified complications: Secondary | ICD-10-CM | POA: Diagnosis not present

## 2016-09-30 DIAGNOSIS — T887XXA Unspecified adverse effect of drug or medicament, initial encounter: Secondary | ICD-10-CM | POA: Diagnosis not present

## 2016-09-30 DIAGNOSIS — I10 Essential (primary) hypertension: Secondary | ICD-10-CM | POA: Diagnosis not present

## 2016-09-30 DIAGNOSIS — E789 Disorder of lipoprotein metabolism, unspecified: Secondary | ICD-10-CM | POA: Diagnosis not present

## 2016-10-07 ENCOUNTER — Encounter: Payer: Self-pay | Admitting: Gastroenterology

## 2016-10-07 ENCOUNTER — Ambulatory Visit (AMBULATORY_SURGERY_CENTER): Payer: BLUE CROSS/BLUE SHIELD | Admitting: Gastroenterology

## 2016-10-07 VITALS — BP 119/76 | HR 59 | Temp 97.8°F | Resp 17 | Ht 70.0 in | Wt 165.0 lb

## 2016-10-07 DIAGNOSIS — Z1211 Encounter for screening for malignant neoplasm of colon: Secondary | ICD-10-CM | POA: Diagnosis present

## 2016-10-07 DIAGNOSIS — D125 Benign neoplasm of sigmoid colon: Secondary | ICD-10-CM

## 2016-10-07 DIAGNOSIS — Z1212 Encounter for screening for malignant neoplasm of rectum: Secondary | ICD-10-CM | POA: Diagnosis not present

## 2016-10-07 DIAGNOSIS — D124 Benign neoplasm of descending colon: Secondary | ICD-10-CM

## 2016-10-07 DIAGNOSIS — D120 Benign neoplasm of cecum: Secondary | ICD-10-CM

## 2016-10-07 DIAGNOSIS — K635 Polyp of colon: Secondary | ICD-10-CM

## 2016-10-07 DIAGNOSIS — D126 Benign neoplasm of colon, unspecified: Secondary | ICD-10-CM | POA: Diagnosis not present

## 2016-10-07 DIAGNOSIS — D123 Benign neoplasm of transverse colon: Secondary | ICD-10-CM | POA: Diagnosis not present

## 2016-10-07 MED ORDER — SODIUM CHLORIDE 0.9 % IV SOLN: 500.0000 mL | INTRAVENOUS | Status: AC

## 2016-10-07 NOTE — Progress Notes (Signed)
Called to room to assist during endoscopic procedure.  Patient ID and intended procedure confirmed with present staff. Received instructions for my participation in the procedure from the performing physician.  

## 2016-10-07 NOTE — Op Note (Signed)
[place] Patient Name: Malik Holland Procedure Date: 10/07/2016 1:26 PM MRN: [id number] Endoscopist: Ladene Artist , MD Age: 61 Referring MD:  Date of Birth: 29-Dec-1955 Gender: Male Account #: [id number] Procedure:                Colonoscopy Indications:              Screening for colorectal malignant neoplasm Medicines:                Monitored Anesthesia Care Procedure:                Pre-Anesthesia Assessment:                           - Prior to the procedure, a History and Physical                            was performed, and patient medications and                            allergies were reviewed. The patient's tolerance of                            previous anesthesia was also reviewed. The risks                            and benefits of the procedure and the sedation                            options and risks were discussed with the patient.                            All questions were answered, and informed consent                            was obtained. Prior Anticoagulants: The patient has                            taken no previous anticoagulant or antiplatelet                            agents. ASA Grade Assessment: II - A patient with                            mild systemic disease. After reviewing the risks                            and benefits, the patient was deemed in                            satisfactory condition to undergo the procedure.                           After obtaining informed consent, the colonoscope  was passed under direct vision. Throughout the                            procedure, the patient's blood pressure, pulse, and                            oxygen saturations were monitored continuously. The                            Model PCF-[id number]) scope was introduced                            through the anus and advanced to the the cecum,                            identified by  appendiceal orifice and ileocecal                            valve. The ileocecal valve, appendiceal orifice,                            and rectum were photographed. The quality of the                            bowel preparation was excellent. The colonoscopy                            was performed without difficulty. The patient                            tolerated the procedure well. Scope In: 1:30:50 PM Scope Out: 1:50:49 PM Scope Withdrawal Time: 0 hours 17 minutes 10 seconds  Total Procedure Duration: 0 hours 19 minutes 59 seconds  Findings:                 The perianal and digital rectal examinations were                            normal.                           A 10 mm polyp was found in the sigmoid colon. The                            polyp was pedunculated. The polyp was removed with                            a hot snare. Resection and retrieval were complete.                           Ten sessile polyps were found in the sigmoid colon                            (1), descending colon (3), transverse colon (3) and  cecum (3). The polyps were 5 to 7 mm in size. These                            polyps were removed with a cold snare. Resection                            and retrieval were complete.                           Internal hemorrhoids were found during                            retroflexion. The hemorrhoids were small and Grade                            I (internal hemorrhoids that do not prolapse).                           The exam was otherwise without abnormality on                            direct and retroflexion views. Complications:            No immediate complications. Estimated blood loss:                            None. Estimated Blood Loss:     Estimated blood loss: none. Impression:               - One 10 mm polyp in the sigmoid colon, removed                            with a hot snare. Resected and retrieved.                            - Ten 5 to 7 mm polyps in the sigmoid colon, in the                            descending colon, in the transverse colon and in                            the cecum, removed with a cold snare. Resected and                            retrieved.                           - Internal hemorrhoids.                           - The examination was otherwise normal on direct                            and retroflexion views. Recommendation:           -  Repeat colonoscopy date to be determined after                            pending pathology results are reviewed for                            surveillance.                           - Patient has a contact number available for                            emergencies. The signs and symptoms of potential                            delayed complications were discussed with the                            patient. Return to normal activities tomorrow.                            Written discharge instructions were provided to the                            patient.                           - Resume previous diet.                           - Continue present medications.                           - Await pathology results.                           - No aspirin, ibuprofen, naproxen, or other                            non-steroidal anti-inflammatory drugs for 2 weeks                            after polyp removal. Ladene Artist, MD 10/07/2016 1:55:08 PM This report has been signed electronically.

## 2016-10-07 NOTE — Progress Notes (Signed)
Report to PACU, RN, vss, BBS= Clear.  

## 2016-10-07 NOTE — Patient Instructions (Signed)
Discharge instructions given. Handouts on polyps and hemorrhoids. Resume previous medications. No ibuprofen, naproxen,aspirin or other NSAIDs for two weeks. YOU HAD AN ENDOSCOPIC PROCEDURE TODAY AT [facility]:   Refer to the procedure report that was given to you for any specific questions about what was found during the examination.  If the procedure report does not answer your questions, please call your gastroenterologist to clarify.  If you requested that your care partner not be given the details of your procedure findings, then the procedure report has been included in a sealed envelope for you to review at your convenience later.  YOU SHOULD EXPECT: Some feelings of bloating in the abdomen. Passage of more gas than usual.  Walking can help get rid of the air that was put into your GI tract during the procedure and reduce the bloating. If you had a lower endoscopy (such as a colonoscopy or flexible sigmoidoscopy) you may notice spotting of blood in your stool or on the toilet paper. If you underwent a bowel prep for your procedure, you may not have a normal bowel movement for a few days.  Please Note:  You might notice some irritation and congestion in your nose or some drainage.  This is from the oxygen used during your procedure.  There is no need for concern and it should clear up in a day or so.  SYMPTOMS TO REPORT IMMEDIATELY:   Following lower endoscopy (colonoscopy or flexible sigmoidoscopy):  Excessive amounts of blood in the stool  Significant tenderness or worsening of abdominal pains  Swelling of the abdomen that is new, acute  Fever of 100F or higher   For urgent or emergent issues, a gastroenterologist can be reached at any hour by calling [phone].   DIET:  We do recommend a small meal at first, but then you may proceed to your regular diet.  Drink plenty of fluids but you should avoid alcoholic beverages for 24 hours.  ACTIVITY:  You should plan  to take it easy for the rest of today and you should NOT DRIVE or use heavy machinery until tomorrow (because of the sedation medicines used during the test).    FOLLOW UP: Our staff will call the number listed on your records the next business day following your procedure to check on you and address any questions or concerns that you may have regarding the information given to you following your procedure. If we do not reach you, we will leave a message.  However, if you are feeling well and you are not experiencing any problems, there is no need to return our call.  We will assume that you have returned to your regular daily activities without incident.  If any biopsies were taken you will be contacted by phone or by letter within the next 1-3 weeks.  Please call us at [phone] if you have not heard about the biopsies in 3 weeks.    SIGNATURES/CONFIDENTIALITY: You and/or your care partner have signed paperwork which will be entered into your electronic medical record.  These signatures attest to the fact that that the information above on your After Visit Summary has been reviewed and is understood.  Full responsibility of the confidentiality of this discharge information lies with you and/or your care-partner.

## 2016-10-08 ENCOUNTER — Telehealth: Payer: Self-pay | Admitting: *Deleted

## 2016-10-08 NOTE — Telephone Encounter (Signed)
  Follow up Call-  Call back number 10/07/2016  Post procedure Call Back phone  # [phone]  Permission to leave phone message Yes  Some recent data might be hidden     Patient questions:  Do you have a fever, pain , or abdominal swelling? No. Pain Score  0 *  Have you tolerated food without any problems? Yes.    Have you been able to return to your normal activities? Yes.    Do you have any questions about your discharge instructions: Diet   No. Medications  No. Follow up visit  No.  Do you have questions or concerns about your Care? No.  Actions: * If pain score is 4 or above: No action needed, pain <4.

## 2016-10-14 ENCOUNTER — Encounter: Payer: Self-pay | Admitting: Gastroenterology

## 2016-12-04 ENCOUNTER — Telehealth: Payer: Self-pay | Admitting: Endocrinology

## 2016-12-04 NOTE — Telephone Encounter (Signed)
Checking on status of referral.  Please update on whether or not it was received.  Ty,  -LL

## 2016-12-19 ENCOUNTER — Ambulatory Visit (INDEPENDENT_AMBULATORY_CARE_PROVIDER_SITE_OTHER): Payer: BLUE CROSS/BLUE SHIELD | Admitting: Endocrinology

## 2016-12-19 ENCOUNTER — Encounter: Payer: Self-pay | Admitting: Endocrinology

## 2016-12-19 VITALS — BP 132/82 | HR 74 | Wt 162.8 lb

## 2016-12-19 DIAGNOSIS — E1149 Type 2 diabetes mellitus with other diabetic neurological complication: Secondary | ICD-10-CM | POA: Diagnosis not present

## 2016-12-19 MED ORDER — LINAGLIPTIN-METFORMIN HCL 2.5-850 MG PO TABS: 1.0000 | ORAL_TABLET | Freq: Every day | ORAL | 3 refills | Status: AC

## 2016-12-19 MED ORDER — FARXIGA 5 MG PO TABS: 5.0000 mg | ORAL_TABLET | Freq: Every day | ORAL | 3 refills | Status: DC

## 2016-12-19 NOTE — Patient Instructions (Addendum)
good diet and exercise significantly improve the control of your diabetes.  please let me know if you wish to be referred to a dietician.  high blood sugar is very risky to your health.  you should see an eye doctor and dentist every year.  It is very important to get all recommended vaccinations.  Controlling your blood pressure and cholesterol drastically reduces the damage diabetes does to your body.  Those who smoke should quit.  Please discuss these with your doctor.  check your blood sugar once a day.  vary the time of day when you check, between before the 3 meals, and at bedtime.  also check if you have symptoms of your blood sugar being too high or too low.  please keep a record of the readings and bring it to your next appointment here (or you can bring the meter itself).  You can write it on any piece of paper.  please call us sooner if your blood sugar goes below 70, or if you have a lot of readings over 200.  Please continue the same medications.   Please come back for a follow-up appointment in 6 months.

## 2016-12-19 NOTE — Progress Notes (Signed)
Subjective:    Patient ID: Malik Holland, male    DOB: 04-15-1955, 61 y.o.   MRN: [id number]  HPI pt is self-referred by for diabetes.  Pt states DM was dx'ed in 2012; he has mild if any neuropathy of the lower extremities, but he has associated TIA; he has never been on insulin; pt says his diet and exercise are good; he has never had pancreatitis, pancreatic surgery, severe hypoglycemia or DKA.  He did not tolerate invokana (heartburn).  He now takes 3 oral meds, and cbg's are in the low-100's.   Past Medical History:  Diagnosis Date  . Cataract 2017   thinks bilaterally , no surgery yet  . Diabetes mellitus without complication ([place])   . Hyperlipidemia   . Hypertension   . TIA (transient ischemic attack) 2002  . Tuberculosis 50 years ago   states he had it as child. did recieve treatment    Past Surgical History:  Procedure Laterality Date  . NO PAST SURGERIES      Social History   Social History  . Marital status: Single    Spouse name: N/A  . Number of children: N/A  . Years of education: N/A   Occupational History  . Not on file.   Social History Main Topics  . Smoking status: Current Some Day Smoker    Types: Cigarettes  . Smokeless tobacco: Never Used  . Alcohol use No  . Drug use: No  . Sexual activity: Not on file   Other Topics Concern  . Not on file   Social History Narrative  . No narrative on file    Current Outpatient Prescriptions on File Prior to Visit  Medication Sig Dispense Refill  . aspirin EC 81 MG tablet Take 81 mg by mouth daily.    . fenofibrate 160 MG tablet Take 160 mg by mouth daily.    Marland Kitchen lisinopril-hydrochlorothiazide (PRINZIDE,ZESTORETIC) 20-25 MG tablet Take 1 tablet by mouth daily.    . pravastatin (PRAVACHOL) 80 MG tablet Take 80 mg by mouth daily.     Current Facility-Administered Medications on File Prior to Visit  Medication Dose Route Frequency Provider Last Rate Last Dose  . 0.9 %  sodium chloride infusion  500 mL  Intravenous Continuous Ladene Artist, MD        No Known Allergies  Family History  Problem Relation Age of Onset  . Gallbladder disease Mother        cancer of gallbladder  . Diabetes Father   . Prostate cancer Father   . Diabetes Sister   . Colon cancer Neg Hx   . Esophageal cancer Neg Hx   . Stomach cancer Neg Hx     BP 132/82   Pulse 74   Wt 162 lb 12.8 oz (73.8 kg)   SpO2 98%   BMI 23.36 kg/m     Review of Systems denies blurry vision, headache, chest pain, sob, n/v, urinary frequency, muscle cramps, excessive diaphoresis, depression, cold intolerance, rhinorrhea, and easy bruising.  He has lost 10 lbs x 2 years.      Objective:   Physical Exam VS: see vs page GEN: no distress HEAD: head: no deformity eyes: no periorbital swelling, no proptosis external nose and ears are normal mouth: no lesion seen NECK: supple, thyroid is not enlarged CHEST WALL: no deformity LUNGS: clear to auscultation CV: reg rate and rhythm, no murmur ABD: abdomen is soft, nontender.  no hepatosplenomegaly.  not distended.  no hernia MUSCULOSKELETAL: muscle  bulk and strength are grossly normal.  no obvious joint swelling.  gait is normal and steady EXTEMITIES: no deformity.  no ulcer on the feet.  feet are of normal color and temp.  Trace bilat leg edema.  There is bilateral onychomycosis of the toenails.   PULSES: dorsalis pedis intact bilat.  no carotid bruit NEURO:  cn 2-12 grossly intact.   readily moves all 4's.  sensation is intact to touch on the feet.   SKIN:  Normal texture and temperature.  No rash or suspicious lesion is visible.   NODES:  None palpable at the neck PSYCH: alert, well-oriented.  Does not appear anxious nor depressed.   I have reviewed outside records, and summarized: Pt saw Dr Rogue Jury in the past, and DM was well-controlled.  Other risk factors were well-controlled, also.   outside test results are reviewed: A1c=6.3%    Assessment & Plan:  Type 2 DM,  with TIA, new to me: well-controlled.  Edema: this limits rx options.    Patient Instructions  good diet and exercise significantly improve the control of your diabetes.  please let me know if you wish to be referred to a dietician.  high blood sugar is very risky to your health.  you should see an eye doctor and dentist every year.  It is very important to get all recommended vaccinations.  Controlling your blood pressure and cholesterol drastically reduces the damage diabetes does to your body.  Those who smoke should quit.  Please discuss these with your doctor.  check your blood sugar once a day.  vary the time of day when you check, between before the 3 meals, and at bedtime.  also check if you have symptoms of your blood sugar being too high or too low.  please keep a record of the readings and bring it to your next appointment here (or you can bring the meter itself).  You can write it on any piece of paper.  please call us sooner if your blood sugar goes below 70, or if you have a lot of readings over 200.  Please continue the same medications.   Please come back for a follow-up appointment in 6 months.

## 2017-01-06 ENCOUNTER — Telehealth: Payer: Self-pay | Admitting: Endocrinology

## 2017-01-06 NOTE — Telephone Encounter (Signed)
Malik Holland from [facility] called she need the office notes for patient new patient visit.

## 2017-01-07 NOTE — Telephone Encounter (Signed)
Called the only number I could find for [facility] in patient's chart & called but had to leave VM. I stated I wasn't sure if I had the right number or person since no call back number was taken down. If they could give me a call back I would be happy to send over OV notes.

## 2017-05-22 ENCOUNTER — Ambulatory Visit: Payer: BLUE CROSS/BLUE SHIELD | Admitting: Endocrinology

## 2018-02-11 DIAGNOSIS — H524 Presbyopia: Secondary | ICD-10-CM | POA: Diagnosis not present

## 2018-12-06 DIAGNOSIS — R590 Localized enlarged lymph nodes: Secondary | ICD-10-CM | POA: Diagnosis not present

## 2018-12-09 ENCOUNTER — Other Ambulatory Visit: Payer: Self-pay | Admitting: Family Medicine

## 2018-12-09 DIAGNOSIS — M7989 Other specified soft tissue disorders: Secondary | ICD-10-CM

## 2018-12-15 ENCOUNTER — Ambulatory Visit
Admission: RE | Admit: 2018-12-15 | Discharge: 2018-12-15 | Disposition: A | Discharge status: Home / Self Care | Payer: BLUE CROSS/BLUE SHIELD | Source: Ambulatory Visit | Attending: Family Medicine | Admitting: Family Medicine

## 2018-12-15 DIAGNOSIS — M7989 Other specified soft tissue disorders: Secondary | ICD-10-CM

## 2018-12-15 DIAGNOSIS — K056 Periodontal disease, unspecified: Secondary | ICD-10-CM | POA: Diagnosis not present

## 2018-12-15 MED ORDER — IOPAMIDOL (ISOVUE-300) INJECTION 61%
75.0000 mL | Freq: Once | INTRAVENOUS | Status: AC | PRN
  Administered 2018-12-15: 75 mL via INTRAVENOUS

## 2018-12-17 DIAGNOSIS — E291 Testicular hypofunction: Secondary | ICD-10-CM | POA: Diagnosis not present

## 2018-12-21 ENCOUNTER — Telehealth: Payer: Self-pay

## 2018-12-21 NOTE — Telephone Encounter (Signed)
Pt called [facility] to ask for NP appt with Dr. Sarajane Jews. Message sent to scheduler to contact pt and advise Dr. Sarajane Jews is not accepting NPs at this time.  Scheduler spoke with pt (documented in CRM): Pt was called and he stated that he was referred by his another doctor.  Pt is aware that Dr. Sarajane Jews is not taking any newpt and he wanted to know why.  I let the pt know that Dr. Sarajane Jews is at his capacity as far as it goes for newpt.  He stated that he did not believe me so he will get back with the doctor that referred him to Dr. Sarajane Jews and get back with [place].  I am not sure who referred the pt to our office, however Dr. Sarajane Jews is NOT accepting NPs at this time. We will reiterate this when/if pt returns call.

## 2019-02-15 ENCOUNTER — Ambulatory Visit: Payer: BC Managed Care – PPO | Admitting: Adult Health

## 2019-05-15 ENCOUNTER — Ambulatory Visit: Payer: BC Managed Care – PPO

## 2019-05-19 ENCOUNTER — Ambulatory Visit: Payer: BC Managed Care – PPO | Admitting: Adult Health

## 2019-07-27 ENCOUNTER — Ambulatory Visit: Payer: BC Managed Care – PPO | Admitting: Adult Health

## 2019-09-26 ENCOUNTER — Encounter: Payer: Self-pay | Admitting: Gastroenterology

## 2019-10-06 ENCOUNTER — Ambulatory Visit (AMBULATORY_SURGERY_CENTER): Payer: Self-pay

## 2019-10-06 ENCOUNTER — Other Ambulatory Visit: Payer: Self-pay

## 2019-10-06 VITALS — Ht 70.0 in | Wt 161.8 lb

## 2019-10-06 DIAGNOSIS — Z86010 Personal history of colonic polyps: Secondary | ICD-10-CM

## 2019-10-06 MED ORDER — SUTAB 1479-225-188 MG PO TABS: 12.0000 | ORAL_TABLET | ORAL | 0 refills | Status: AC

## 2019-10-06 NOTE — Progress Notes (Signed)
No egg or soy allergy known to patient  No issues with past sedation with any surgeries  or procedures, no intubation problems  No diet pills per patient No home 02 use per patient  No blood thinners per patient  Pt denies issues with constipation  No A fib or A flutter  EMMI video sent to pt's e mail  COVID 19 guidelines implemented in [place] today   Pt has been vaccinated for covid.  Due to the COVID-19 pandemic we are asking patients to follow these guidelines. Please only bring one care partner. Please be aware that your care partner may wait in the car in the parking lot or if they feel like they will be too hot to wait in the car, they may wait in the lobby on the 4th floor. All care partners are required to wear a mask the entire time (we do not have any that we can provide them), they need to practice social distancing, and we will do a Covid check for all patient's and care partners when you arrive. Also we will check their temperature and your temperature. If the care partner waits in their car they need to stay in the parking lot the entire time and we will call them on their cell phone when the patient is ready for discharge so they can bring the car to the front of the building. Also all patient's will need to wear a mask into building.

## 2019-10-10 ENCOUNTER — Encounter: Payer: Self-pay | Admitting: Gastroenterology

## 2019-10-19 ENCOUNTER — Ambulatory Visit (AMBULATORY_SURGERY_CENTER): Payer: BC Managed Care – PPO | Admitting: Gastroenterology

## 2019-10-19 ENCOUNTER — Encounter: Payer: Self-pay | Admitting: Gastroenterology

## 2019-10-19 VITALS — BP 114/68 | HR 62 | Temp 97.6°F | Resp 16 | Ht 70.0 in | Wt 161.0 lb

## 2019-10-19 DIAGNOSIS — D123 Benign neoplasm of transverse colon: Secondary | ICD-10-CM

## 2019-10-19 DIAGNOSIS — D125 Benign neoplasm of sigmoid colon: Secondary | ICD-10-CM

## 2019-10-19 DIAGNOSIS — Z86010 Personal history of colonic polyps: Secondary | ICD-10-CM | POA: Diagnosis not present

## 2019-10-19 DIAGNOSIS — K635 Polyp of colon: Secondary | ICD-10-CM | POA: Diagnosis not present

## 2019-10-19 DIAGNOSIS — D124 Benign neoplasm of descending colon: Secondary | ICD-10-CM

## 2019-10-19 MED ORDER — SODIUM CHLORIDE 0.9 % IV SOLN: 500.0000 mL | INTRAVENOUS | Status: DC

## 2019-10-19 NOTE — Progress Notes (Signed)
Vs CW I have reviewed the patient's medical history in detail and updated the computerized patient record.   

## 2019-10-19 NOTE — Progress Notes (Signed)
To PACU, VSS. Report to [email or web address] 

## 2019-10-19 NOTE — Patient Instructions (Signed)
Handouts given for polyps and hemorrhoids. ? ?YOU HAD AN ENDOSCOPIC PROCEDURE TODAY AT THE [facility]:   Refer to the procedure report that was given to you for any specific questions about what was found during the examination.  If the procedure report does not answer your questions, please call your gastroenterologist to clarify.  If you requested that your care partner not be given the details of your procedure findings, then the procedure report has been included in a sealed envelope for you to review at your convenience later. ? ?YOU SHOULD EXPECT: Some feelings of bloating in the abdomen. Passage of more gas than usual.  Walking can help get rid of the air that was put into your GI tract during the procedure and reduce the bloating. If you had a lower endoscopy (such as a colonoscopy or flexible sigmoidoscopy) you may notice spotting of blood in your stool or on the toilet paper. If you underwent a bowel prep for your procedure, you may not have a normal bowel movement for a few days. ? ?Please Note:  You might notice some irritation and congestion in your nose or some drainage.  This is from the oxygen used during your procedure.  There is no need for concern and it should clear up in a day or so. ? ?SYMPTOMS TO REPORT IMMEDIATELY: ? ?Following lower endoscopy (colonoscopy or flexible sigmoidoscopy): ? Excessive amounts of blood in the stool ? Significant tenderness or worsening of abdominal pains ? Swelling of the abdomen that is new, acute ? Fever of 100?F or higher ? ?For urgent or emergent issues, a gastroenterologist can be reached at any hour by calling [phone]. ?Do not use MyChart messaging for urgent concerns.  ? ? ?DIET:  We do recommend a small meal at first, but then you may proceed to your regular diet.  Drink plenty of fluids but you should avoid alcoholic beverages for 24 hours. ? ?ACTIVITY:  You should plan to take it easy for the rest of today and you should NOT DRIVE or  use heavy machinery until tomorrow (because of the sedation medicines used during the test).   ? ?FOLLOW UP: ?Our staff will call the number listed on your records 48-72 hours following your procedure to check on you and address any questions or concerns that you may have regarding the information given to you following your procedure. If we do not reach you, we will leave a message.  We will attempt to reach you two times.  During this call, we will ask if you have developed any symptoms of COVID 19. If you develop any symptoms (ie: fever, flu-like symptoms, shortness of breath, cough etc.) before then, please call [phone].  If you test positive for Covid 19 in the 2 weeks post procedure, please call and report this information to us.   ? ?If any biopsies were taken you will be contacted by phone or by letter within the next 1-3 weeks.  Please call us at [phone] if you have not heard about the biopsies in 3 weeks.  ? ? ?SIGNATURES/CONFIDENTIALITY: ?You and/or your care partner have signed paperwork which will be entered into your electronic medical record.  These signatures attest to the fact that that the information above on your After Visit Summary has been reviewed and is understood.  Full responsibility of the confidentiality of this discharge information lies with you and/or your care-partner.  ?

## 2019-10-19 NOTE — Op Note (Signed)
[facility] Patient Name: Malik Holland Procedure Date: 10/19/2019 8:56 AM MRN: [id number] Endoscopist: Ladene Artist , MD Age: 64 Referring MD:  Date of Birth: 1955/05/04 Gender: Male Account #: [id number] Procedure:                Colonoscopy Indications:              High risk colon cancer surveillance: Personal                            history of colonic polyps: TAs, SSPs Medicines:                Monitored Anesthesia Care Procedure:                Pre-Anesthesia Assessment:                           - Prior to the procedure, a History and Physical                            was performed, and patient medications and                            allergies were reviewed. The patient's tolerance of                            previous anesthesia was also reviewed. The risks                            and benefits of the procedure and the sedation                            options and risks were discussed with the patient.                            All questions were answered, and informed consent                            was obtained. Prior Anticoagulants: The patient has                            taken no previous anticoagulant or antiplatelet                            agents. ASA Grade Assessment: II - A patient with                            mild systemic disease. After reviewing the risks                            and benefits, the patient was deemed in                            satisfactory condition to undergo the procedure.  After obtaining informed consent, the colonoscope                            was passed under direct vision. Throughout the                            procedure, the patient's blood pressure, pulse, and                            oxygen saturations were monitored continuously. The                            Colonoscope was introduced through the anus and                            advanced to the the cecum,  identified by                            appendiceal orifice and ileocecal valve. The                            ileocecal valve, appendiceal orifice, and rectum                            were photographed. The quality of the bowel                            preparation was excellent. The colonoscopy was                            performed without difficulty. The patient tolerated                            the procedure well. Scope In: 9:08:25 AM Scope Out: 9:27:06 AM Scope Withdrawal Time: 0 hours 13 minutes 56 seconds  Total Procedure Duration: 0 hours 18 minutes 41 seconds  Findings:                 The perianal and digital rectal examinations were                            normal.                           Eight sessile polyps were found in the sigmoid                            colon (1), descending colon (1) and transverse                            colon (8). The polyps were 5 to 7 mm in size. These                            polyps were removed with a cold snare. Resection  and retrieval were complete.                           Internal hemorrhoids were found during                            retroflexion. The hemorrhoids were small and Grade                            I (internal hemorrhoids that do not prolapse).                           The exam was otherwise without abnormality on                            direct and retroflexion views. Complications:            No immediate complications. Estimated blood loss:                            None. Estimated Blood Loss:     Estimated blood loss: none. Impression:               - Eight 5 to 7 mm polyps in the sigmoid colon, in                            the descending colon and in the transverse colon,                            removed with a cold snare. Resected and retrieved.                           - Internal hemorrhoids.                           - The examination was otherwise normal on  direct                            and retroflexion views. Recommendation:           - Repeat colonoscopy in 3 - 5 years for                            surveillance based on pathology results.                           - Patient has a contact number available for                            emergencies. The signs and symptoms of potential                            delayed complications were discussed with the                            patient. Return to normal activities  tomorrow.                            Written discharge instructions were provided to the                            patient.                           - Resume previous diet.                           - Continue present medications.                           - Await pathology results. Ladene Artist, MD 10/19/2019 9:31:53 AM This report has been signed electronically.

## 2019-10-21 ENCOUNTER — Telehealth: Payer: Self-pay | Admitting: *Deleted

## 2019-10-21 NOTE — Telephone Encounter (Signed)
°  Follow up Call-  Call back number 10/19/2019  Post procedure Call Back phone  # [phone]  Permission to leave phone message Yes  Some recent data might be hidden     Patient questions:  Do you have a fever, pain , or abdominal swelling? No. Pain Score  0 *  Have you tolerated food without any problems? Yes.    Have you been able to return to your normal activities? Yes.    Do you have any questions about your discharge instructions: Diet   No. Medications  No. Follow up visit  No.  Do you have questions or concerns about your Care? No.  Actions: * If pain score is 4 or above: No action needed, pain <4.  1. Have you developed a fever since your procedure? no  2.   Have you had an respiratory symptoms (SOB or cough) since your procedure? no  3.   Have you tested positive for COVID 19 since your procedure no  4.   Have you had any family members/close contacts diagnosed with the COVID 19 since your procedure?  no   If yes to any of these questions please route to Joylene John, RN and Erenest Rasher, RN

## 2019-10-25 ENCOUNTER — Encounter: Payer: Self-pay | Admitting: Gastroenterology

## 2020-03-28 IMAGING — CT CT MAXILLOFACIAL W/ CM
3 of 6 series · 16 of 47 positions shown, 19 images · IV contrast (iopamidol)
Comparison: None.

CLINICAL DATA: Soft tissue mass of the sublingual region.
Left-sided.

EXAM:
CT MAXILLOFACIAL WITH CONTRAST
TECHNIQUE: Multidetector CT imaging of the maxillofacial structures was
performed with intravenous contrast. Multiplanar CT image
reconstructions were also generated.
CONTRAST:  75mL [id number] IOPAMIDOL (FNY2UA-X44) INJECTION 61%

[Series 2: maxillofacial 2.00 hr60 s3 axial · axial · 0.39mm/px · z∈[-689,-517]mm · 11 of 102 slices shown, 14 images]
[im 8/102  brain]
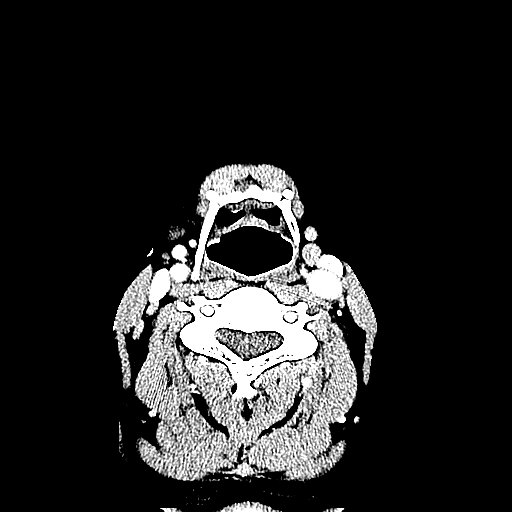
[im 8/102  bone]
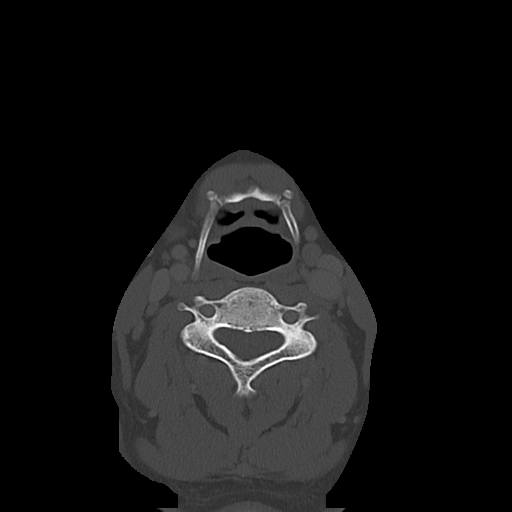
[im 15/102  bone]
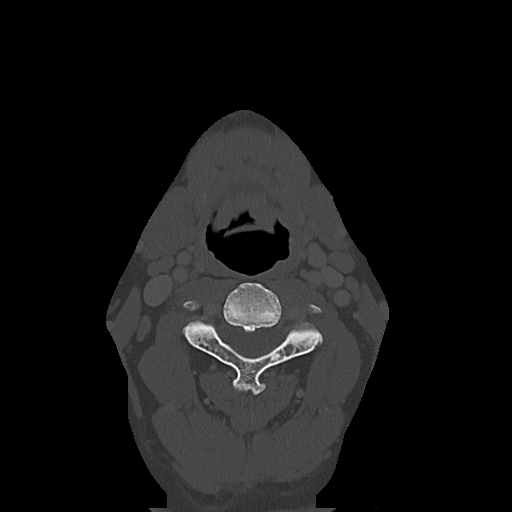
[im 22/102  bone]
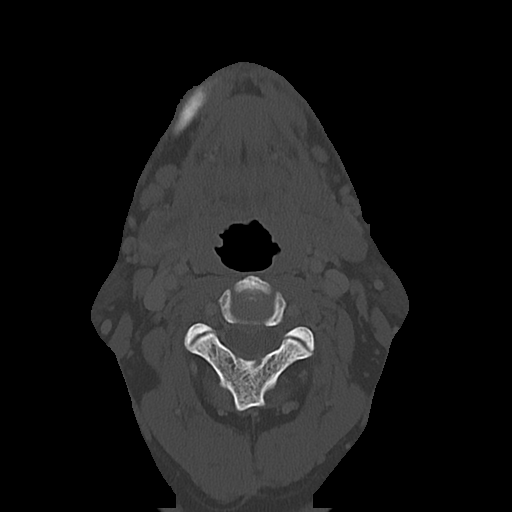
[im 37/102  bone]
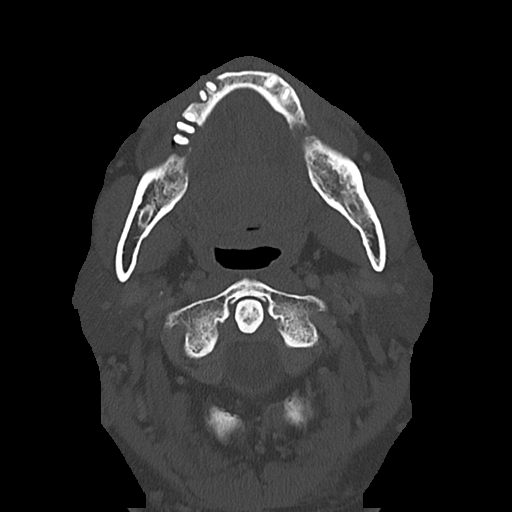
[im 44/102  brain]
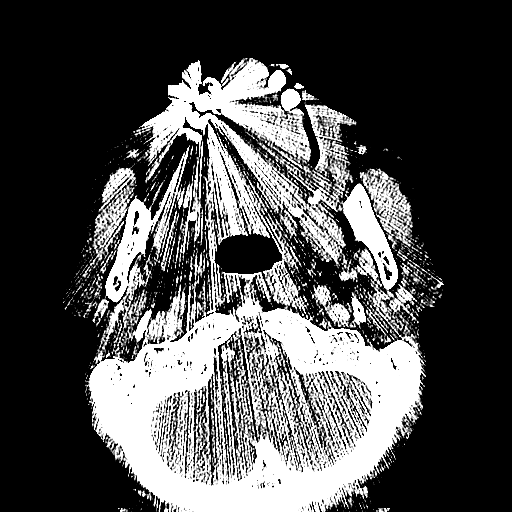
[im 44/102  bone]
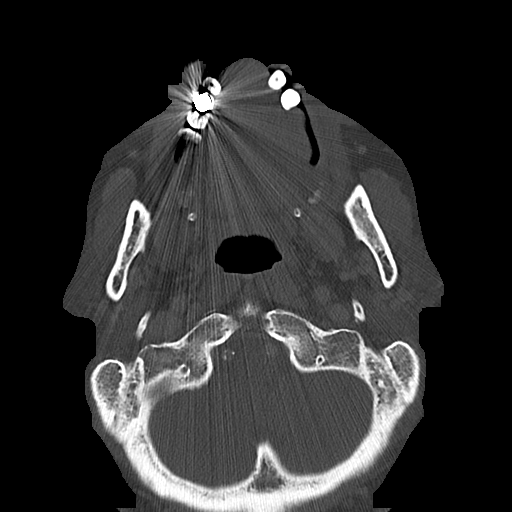
[im 51/102  bone]
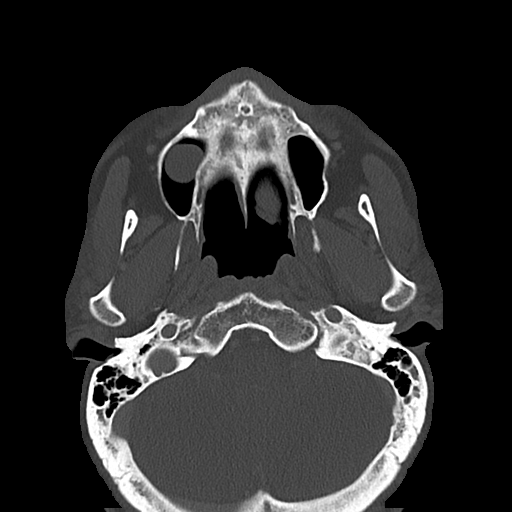
[im 58/102  bone]
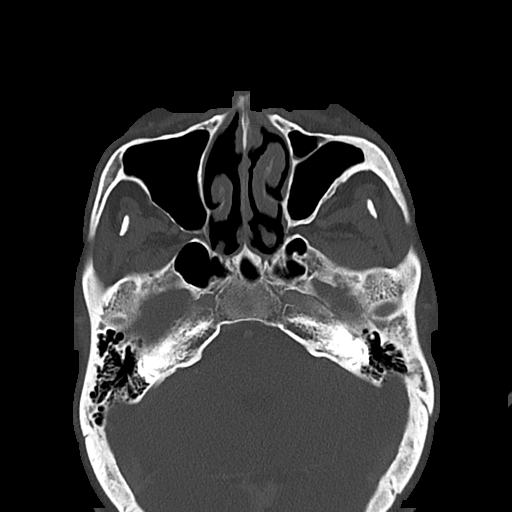
[im 65/102  bone]
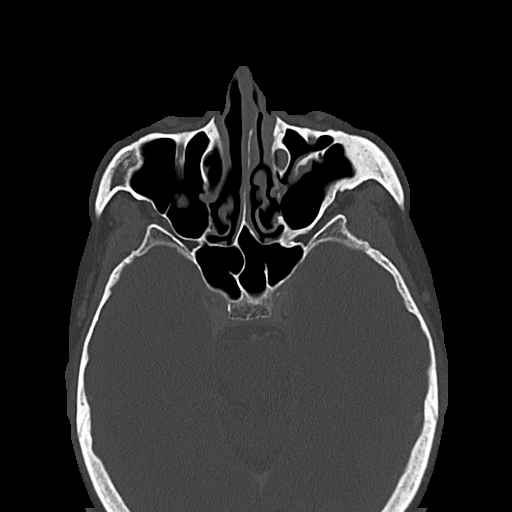
[im 80/102  brain]
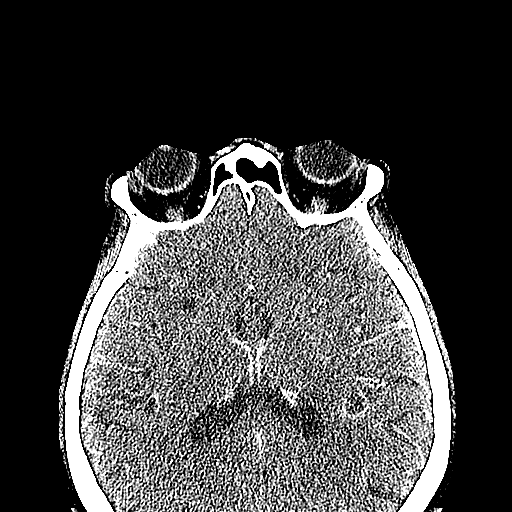
[im 80/102  bone]
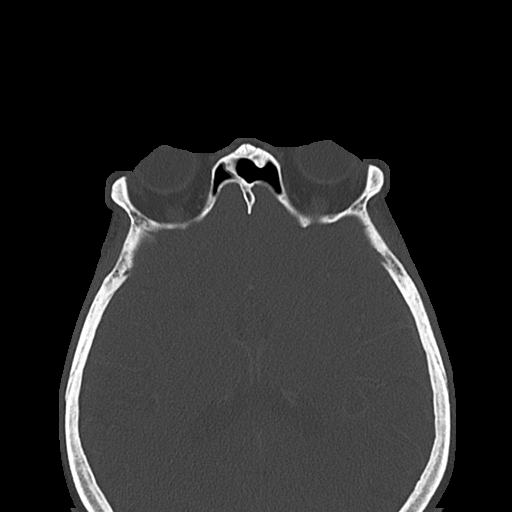
[im 87/102  bone]
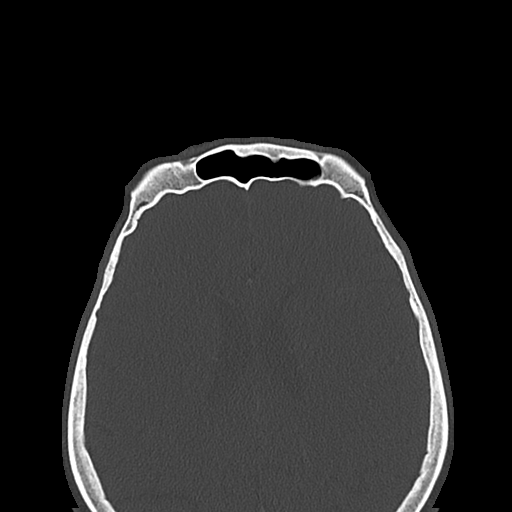
[im 94/102  bone]
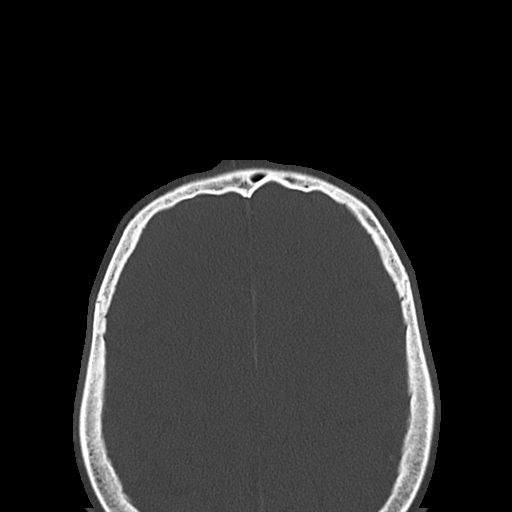

[Series 6: maxillofacial 2.00 hr60 s3 cor · coronal · 0.39mm/px · 3 of 99 slices shown]
[im 25/99  bone]
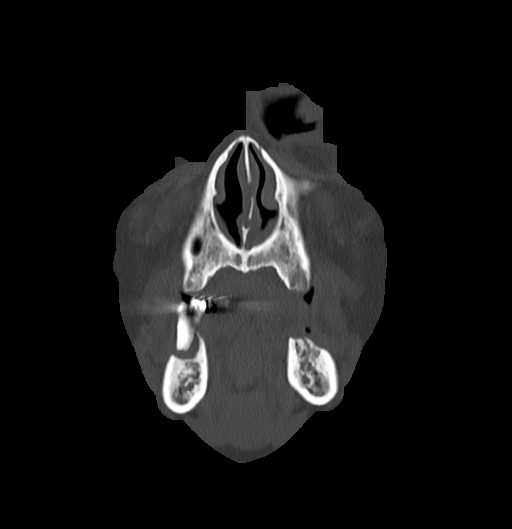
[im 50/99  bone]
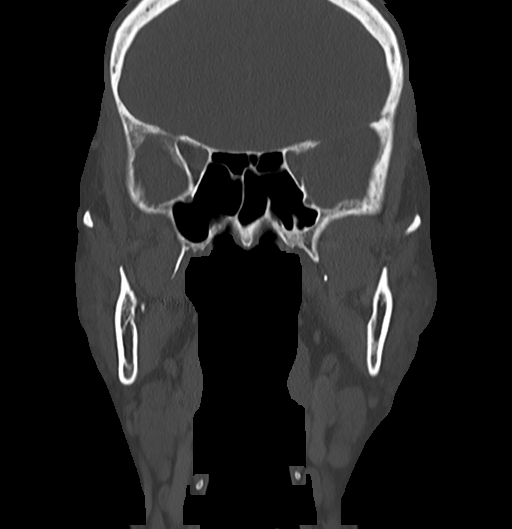
[im 74/99  bone]
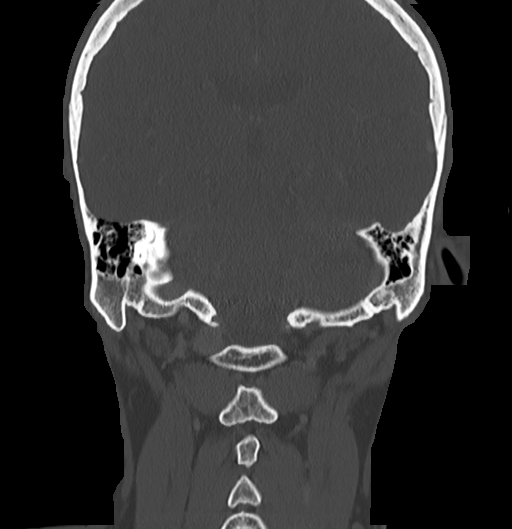

[Series 12: maxillofacial 2.00 hr40 s3 sag · sagittal · 0.39mm/px · 2 of 99 slices shown]
[im 33/99  bone]
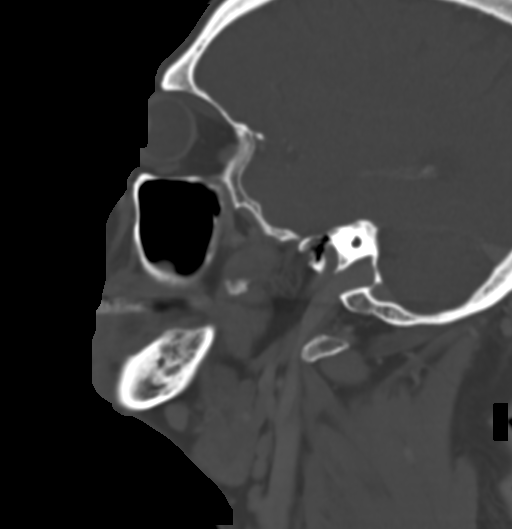
[im 66/99  bone]
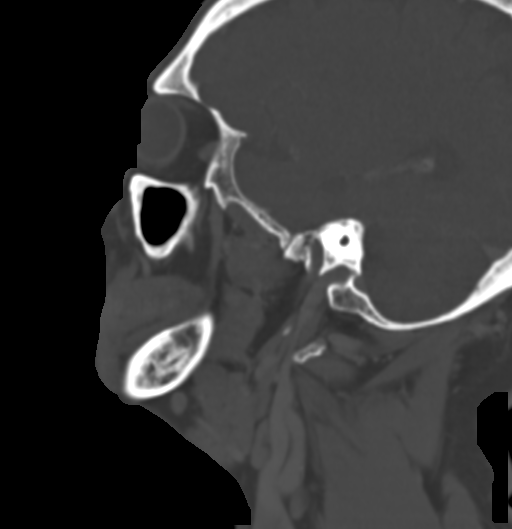

[16 of 47 positions shown; findings below may reference images not displayed]

FINDINGS: Osseous: Negative except for ordinary cervical degenerative changes
and dental and periodontal disease, particularly of the right
mandibular dentition.

Orbits: Normal

Sinuses: Normal except for a retention cyst at the right maxillary
sinus floor.

Soft tissues: Parotid glands are normal. Submandibular glands appear
normal and symmetric by CT. Skin marker placed in the region of
concern does overlie the left submandibular gland, but I do not see
any pathologic features.

Limited intracranial: Normal
IMPRESSION: No pathologic findings by CT. Skin marker does overlie the left
submandibular gland, but there is no evidence of mass, adenopathy or
other finding of significance. There are only normal appearing
regional structures.

Advanced dental and periodontal disease, particularly of the right
mandibular dentition.
